# Patient Record
Sex: Female | Born: 1980 | Race: White | Hispanic: No | Marital: Single | Smoking: Never smoker
Health system: Southern US, Community
[De-identification: ages and names within clinical notes are randomized; demographics above are authoritative.]

## PROBLEM LIST (undated history)

## (undated) DIAGNOSIS — I10 Essential (primary) hypertension: Secondary | ICD-10-CM

## (undated) DIAGNOSIS — F419 Anxiety disorder, unspecified: Secondary | ICD-10-CM

---

## 2009-06-21 ENCOUNTER — Emergency Department (HOSPITAL_BASED_OUTPATIENT_CLINIC_OR_DEPARTMENT_OTHER): Admission: EM | Admit: 2009-06-21 | Discharge: 2009-06-21 | Payer: Self-pay | Admitting: Emergency Medicine

## 2009-06-21 ENCOUNTER — Ambulatory Visit: Payer: Self-pay | Admitting: Radiology

## 2010-08-25 LAB — RAPID STREP SCREEN (MED CTR MEBANE ONLY): Streptococcus, Group A Screen (Direct): NEGATIVE

## 2016-05-18 ENCOUNTER — Emergency Department (HOSPITAL_BASED_OUTPATIENT_CLINIC_OR_DEPARTMENT_OTHER): Payer: 59

## 2016-05-18 ENCOUNTER — Emergency Department (HOSPITAL_BASED_OUTPATIENT_CLINIC_OR_DEPARTMENT_OTHER)
Admission: EM | Admit: 2016-05-18 | Discharge: 2016-05-18 | Disposition: A | Discharge status: Home / Self Care | Payer: 59 | Attending: Emergency Medicine | Admitting: Emergency Medicine

## 2016-05-18 ENCOUNTER — Encounter (HOSPITAL_BASED_OUTPATIENT_CLINIC_OR_DEPARTMENT_OTHER): Payer: Self-pay | Admitting: Emergency Medicine

## 2016-05-18 DIAGNOSIS — S99912A Unspecified injury of left ankle, initial encounter: Secondary | ICD-10-CM | POA: Diagnosis present

## 2016-05-18 DIAGNOSIS — Y999 Unspecified external cause status: Secondary | ICD-10-CM | POA: Diagnosis not present

## 2016-05-18 DIAGNOSIS — Y9389 Activity, other specified: Secondary | ICD-10-CM | POA: Insufficient documentation

## 2016-05-18 DIAGNOSIS — Y9259 Other trade areas as the place of occurrence of the external cause: Secondary | ICD-10-CM | POA: Diagnosis not present

## 2016-05-18 DIAGNOSIS — Z79899 Other long term (current) drug therapy: Secondary | ICD-10-CM | POA: Insufficient documentation

## 2016-05-18 DIAGNOSIS — W1839XA Other fall on same level, initial encounter: Secondary | ICD-10-CM | POA: Diagnosis not present

## 2016-05-18 DIAGNOSIS — S92002A Unspecified fracture of left calcaneus, initial encounter for closed fracture: Secondary | ICD-10-CM | POA: Diagnosis not present

## 2016-05-18 DIAGNOSIS — I10 Essential (primary) hypertension: Secondary | ICD-10-CM | POA: Insufficient documentation

## 2016-05-18 HISTORY — DX: Anxiety disorder, unspecified: F41.9

## 2016-05-18 HISTORY — DX: Essential (primary) hypertension: I10

## 2016-05-18 MED ORDER — HYDROCODONE-ACETAMINOPHEN 5-325 MG PO TABS: 1.0000 | ORAL_TABLET | ORAL | 0 refills | Status: AC | PRN

## 2016-05-18 MED ORDER — NAPROXEN 500 MG PO TABS: 500.0000 mg | ORAL_TABLET | Freq: Two times a day (BID) | ORAL | 0 refills | Status: AC

## 2016-05-18 NOTE — ED Triage Notes (Signed)
Pt fell through garage ceiling, landing on left ankle.  Pt c/o pain to left ankle.  Denies any other pain or known injury at this time.

## 2016-05-18 NOTE — ED Notes (Signed)
ED Provider at bedside. 

## 2016-05-18 NOTE — ED Notes (Signed)
Pt states she was getting Christmas decorations out of the attic and fell through injuring her left ankle. Extremity elevated and ice applied. +dpp palp. Feels touch. Cap refill < 3 sec.

## 2016-05-18 NOTE — ED Provider Notes (Signed)
[facility]   CSN: [id number] Arrival date & time: 05/18/16  1528 By signing my name below, I, Bridgette HabermannMaria Tan, attest that this documentation has been prepared under the direction and in the presence of Linwood DibblesJon Ammi Hutt, MD. Electronically Signed: Bridgette HabermannMaria Tan, ED Scribe. 05/18/16. 4:35 PM.  History   Chief Complaint Chief Complaint  Patient presents with  . Ankle Pain   HPI Comments: Kristen Stanley is a 35 y.o. female with h/o HTN who presents to the Emergency Department complaining of left ankle pain s/p mechanical fall just PTA. Pt states she went to get Christmas decorations in her attic and fell through the garage ceiling, landing on concrete. No LOC, pt denies head injury. Pain is exacerbated with bearing weight and movement. She has not taken any OTC medications PTA. She denies any additional injuries. Pt further denies fever, numbness, or any other associated symptoms.  The history is provided by the patient. No language interpreter was used.    Past Medical History:  Diagnosis Date  . Anxiety   . Hypertension     There are no active problems to display for this patient.   Past Surgical History:  Procedure Laterality Date  . CESAREAN SECTION      OB History    No data available       Home Medications    Prior to Admission medications   Medication Sig Start Date End Date Taking? Authorizing Provider  ALPRAZolam Prudy Feeler(XANAX) 0.5 MG tablet Take 0.5 mg by mouth at bedtime as needed for anxiety.   Yes Historical Provider, MD  metoprolol tartrate (LOPRESSOR) 25 MG tablet Take 25 mg by mouth 2 (two) times daily.   Yes Historical Provider, MD  naproxen (NAPROSYN) 500 MG tablet Take 1 tablet (500 mg total) by mouth 2 (two) times daily. 05/18/16   Linwood DibblesJon Erionna Strum, MD    Family History No family history on file.  Social History Social History  Substance Use Topics  . Smoking status: Never Smoker  . Smokeless tobacco: Never Used  . Alcohol use 1.2 oz/week    2  Glasses of wine per week     Comment: occaional, had 2 glasses today.     Allergies   Codeine; Erythromycin; and Penicillins   Review of Systems Review of Systems  Constitutional: Negative for chills and fever.  Musculoskeletal: Positive for arthralgias, joint swelling and myalgias.  Neurological: Negative for numbness.  All other systems reviewed and are negative.  Physical Exam Updated Vital Signs BP (!) 163/122 (BP Location: Right Arm) Comment: 142/112  Pulse 102   Temp 97.7 F (36.5 C) (Oral)   Resp 18   Ht 5\' 3"  (1.6 m)   Wt 69.4 kg   LMP  (LMP Unknown)   SpO2 100%   BMI 27.10 kg/m   Physical Exam  Constitutional: She appears well-developed and well-nourished. No distress.  HENT:  Head: Normocephalic and atraumatic.  Right Ear: External ear normal.  Left Ear: External ear normal.  Eyes: Conjunctivae are normal. Right eye exhibits no discharge. Left eye exhibits no discharge. No scleral icterus.  Neck: Neck supple. No tracheal deviation present.  Cardiovascular: Normal rate.   Pulmonary/Chest: Effort normal. No stridor. No respiratory distress.  Abdominal: She exhibits no distension.  Musculoskeletal: She exhibits no edema.       Left knee: Normal.       Left ankle: She exhibits decreased range of motion and swelling. Tenderness. No lateral malleolus and no medial malleolus tenderness found.  Cervical back: Normal.       Thoracic back: Normal. She exhibits no tenderness.       Lumbar back: Normal. She exhibits no tenderness.  Tenderness to palpation on calcaneus.  Neurological: She is alert. Cranial nerve deficit: no gross deficits.  Skin: Skin is warm and dry. No rash noted.  Psychiatric: She has a normal mood and affect.  Nursing note and vitals reviewed.    ED Treatments / Results  DIAGNOSTIC STUDIES: Oxygen Saturation is 100% on RA, normal by my interpretation.    COORDINATION OF CARE: 4:35 PM Discussed treatment plan with pt at bedside which  includes splint, crutches, and orthopedic referral and pt agreed to plan.   Radiology Dg Ankle Complete Left  Result Date: 05/18/2016 CLINICAL DATA:  Recent fall with left ankle pain, initial encounter EXAM: LEFT ANKLE COMPLETE - 3+ VIEW COMPARISON:  None. FINDINGS: Considerable soft tissue swelling is noted laterally. There are changes consistent with a comminuted calcaneal fracture with mild impaction at the fracture site. No other definitive abnormality is seen. IMPRESSION: Comminuted calcaneal fracture with associated soft tissue swelling. Electronically Signed   By: Alcide CleverMark  Lukens M.D.   On: 05/18/2016 16:11    Procedures Procedures (including critical care time)  Medications Ordered in ED Medications - No data to display   Initial Impression / Assessment and Plan / ED Course  I have reviewed the triage vital signs and the nursing notes.  Pertinent labs & imaging results that were available during my care of the patient were reviewed by me and considered in my medical decision making (see chart for details).  Clinical Course     Xray shows a calcaneal fracture .   No knee or spinal tenderness to suggest associated injuries.   Will refer to Dr Lequita HaltALuisio as an outpatient.   CT scan performed in the ED to help determine her follow up treatment    CT scan reviewed. Additional ligamentous injury noted. Small talus avulsion fracture also noted  Placed in a splint and provided crutches.  Will need to remain non weight bearing.    Final Clinical Impressions(s) / ED Diagnoses   Final diagnoses:  Closed displaced fracture of left calcaneus, unspecified portion of calcaneus, initial encounter    New Prescriptions New Prescriptions   NAPROXEN (NAPROSYN) 500 MG TABLET    Take 1 tablet (500 mg total) by mouth 2 (two) times daily.   I personally performed the services described in this documentation, which was scribed in my presence.  The recorded information has been reviewed and is  accurate.     Linwood DibblesJon Axavier Pressley, MD 05/19/16 [phone]

## 2016-05-18 NOTE — ED Triage Notes (Signed)
Ice pack applied in triage.

## 2016-05-18 NOTE — Discharge Instructions (Signed)
Follow up with Dr Lequita HaltAluisio to determine further treatment for the fracture.  Avoid putting any weight on your injured foot.  Keep the splint on and use the crutches for support.

## 2018-04-24 IMAGING — CT CT FOOT*L* W/O CM
4 of 6 series · 14 of 33 positions shown, 15 images · non-contrast
Comparison: Radiographs from 05/18/2016

CLINICAL DATA: Fall 3 ceiling in to see minute floor of garage.
Left ankle pain and swelling. Calcaneal fracture.

EXAM:
CT OF THE LEFT FOOT WITHOUT CONTRAST
TECHNIQUE: Multidetector CT imaging of the left foot was performed according to
the standard protocol. Multiplanar CT image reconstructions were
also generated.

[Series 6: axial bone · axial · 0.49mm/px · z∈[-207,-90]mm · 3 of 252 slices shown, 4 images]
[im 63/252  soft-tissue]
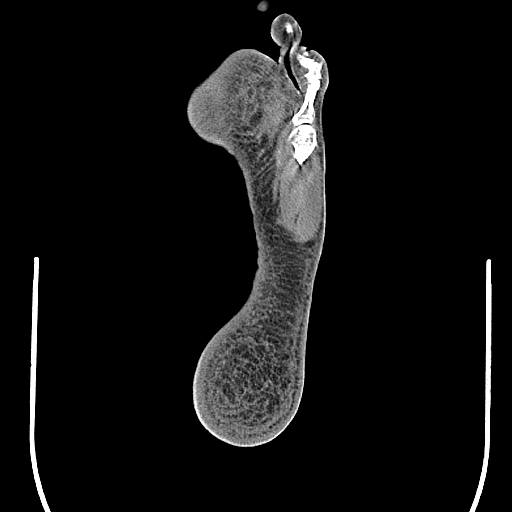
[im 63/252  bone]
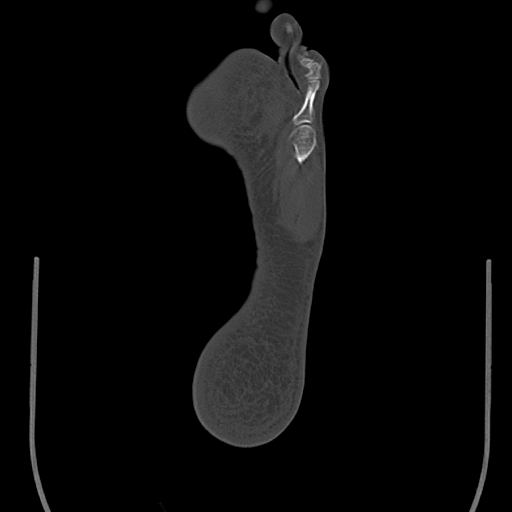
[im 126/252  bone]
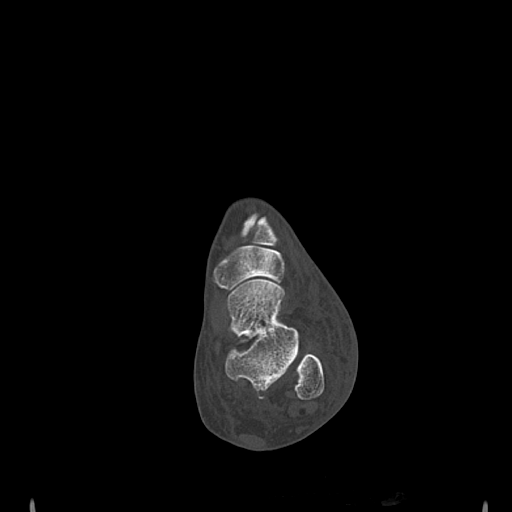
[im 189/252  bone]
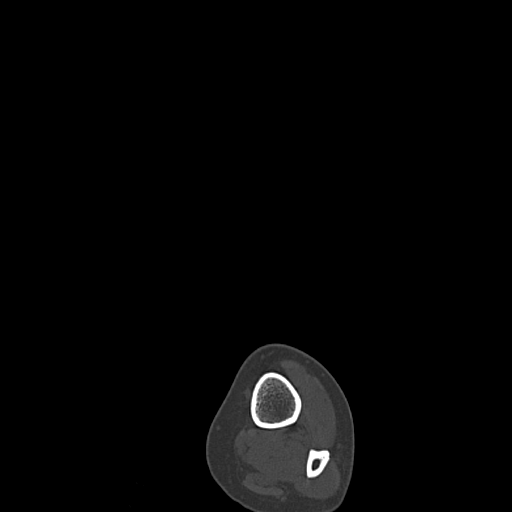

[Series 8: sag bone · sagittal · 0.47mm/px · 5 of 251 slices shown]
[im 42/251  bone]
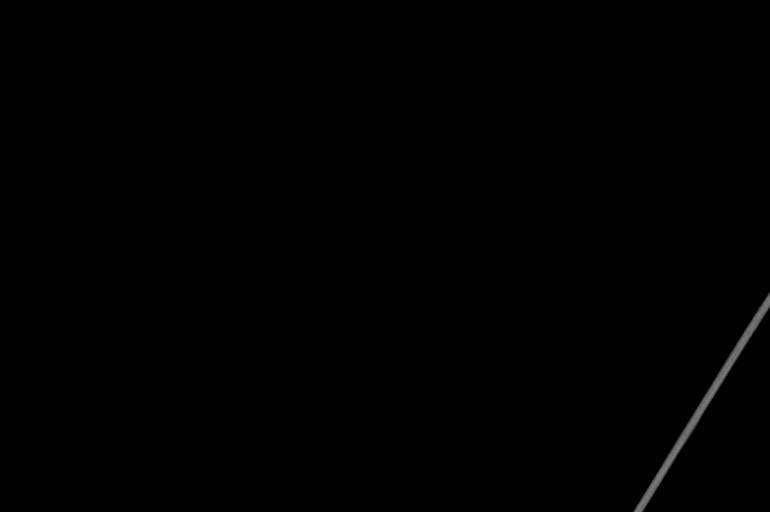
[im 84/251  bone]
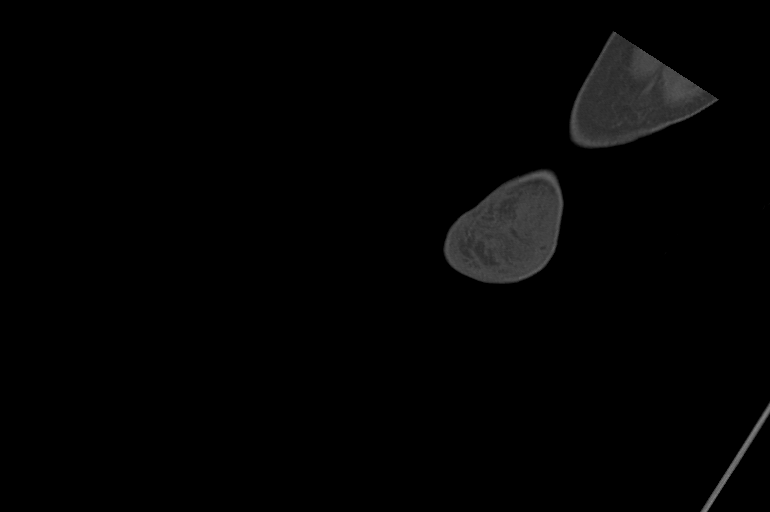
[im 126/251  bone]
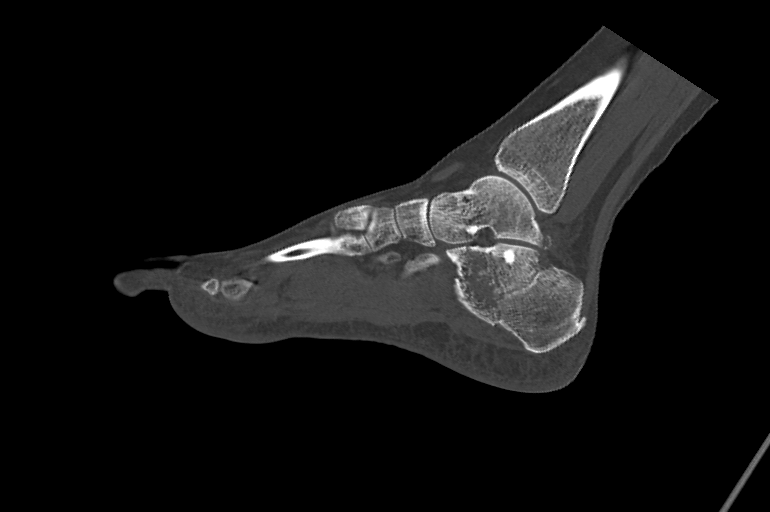
[im 167/251  bone]
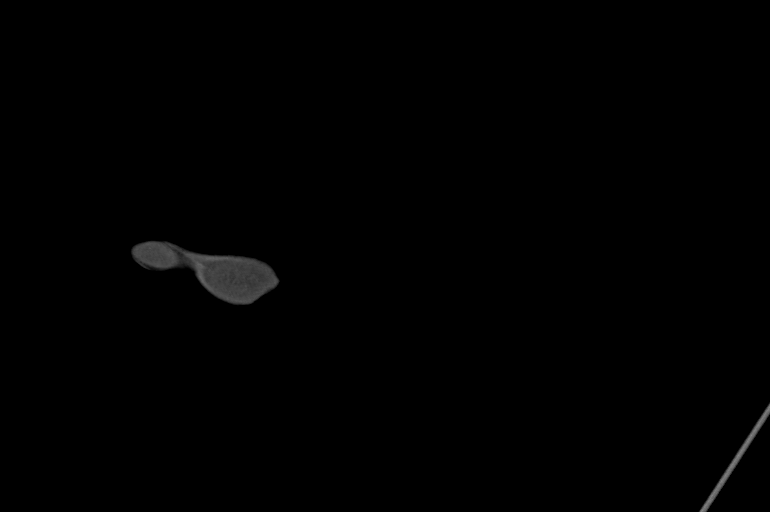
[im 209/251  bone]
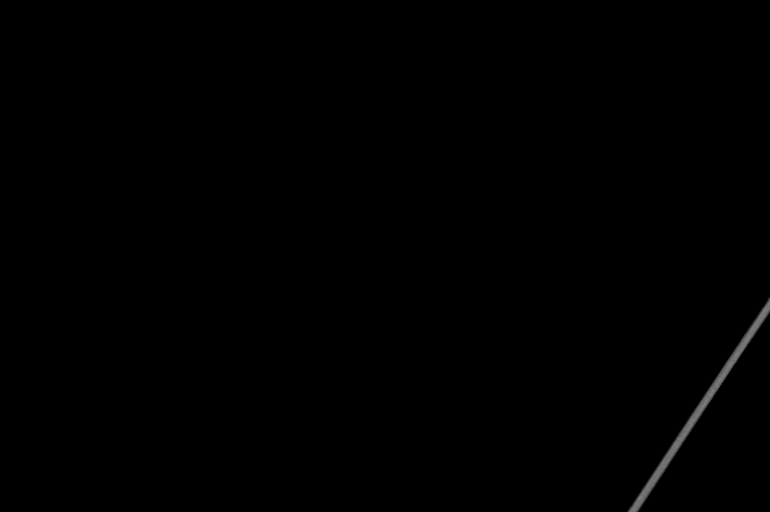

[Series 9: axial st · axial · 0.45mm/px · z∈[-234,-127]mm · 3 of 232 slices shown]
[im 58/232  bone]
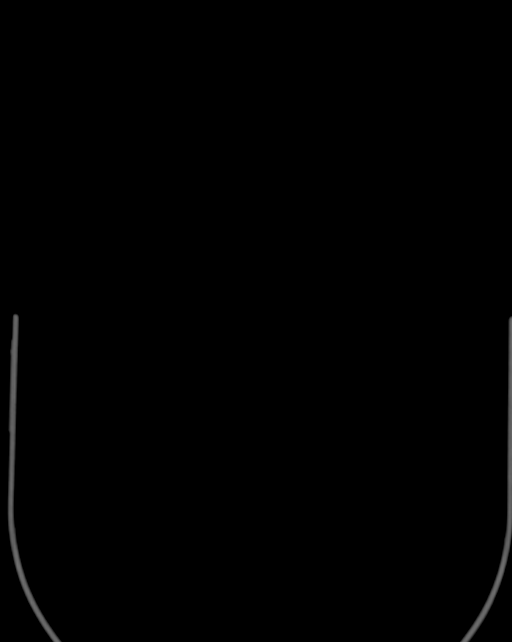
[im 116/232  bone]
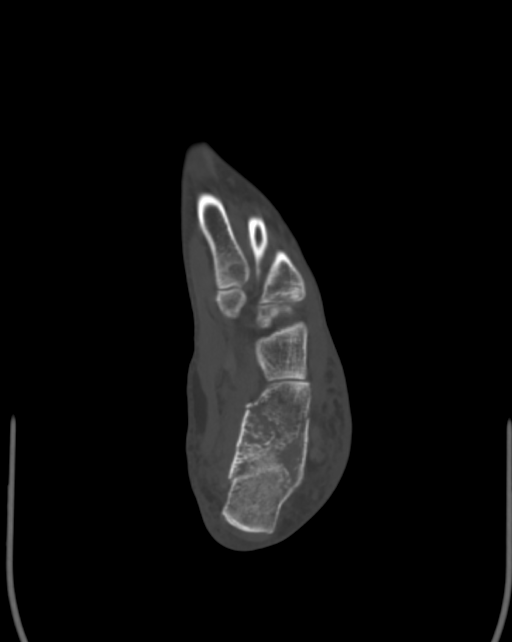
[im 174/232  bone]
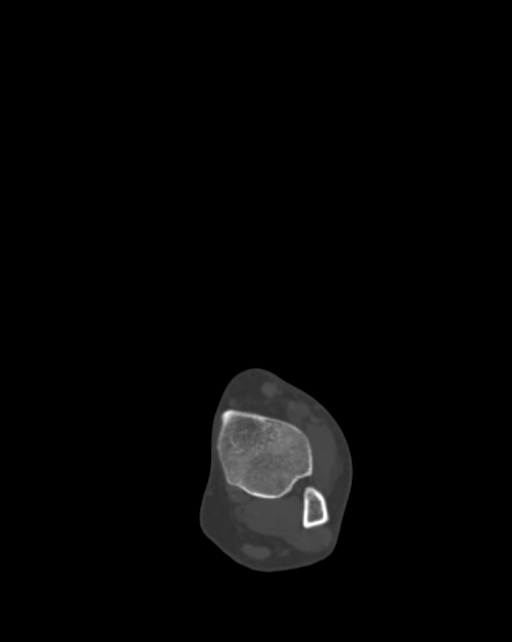

[Series 10: cor st · coronal · 0.31mm/px · 3 of 266 slices shown]
[im 106/266  bone]
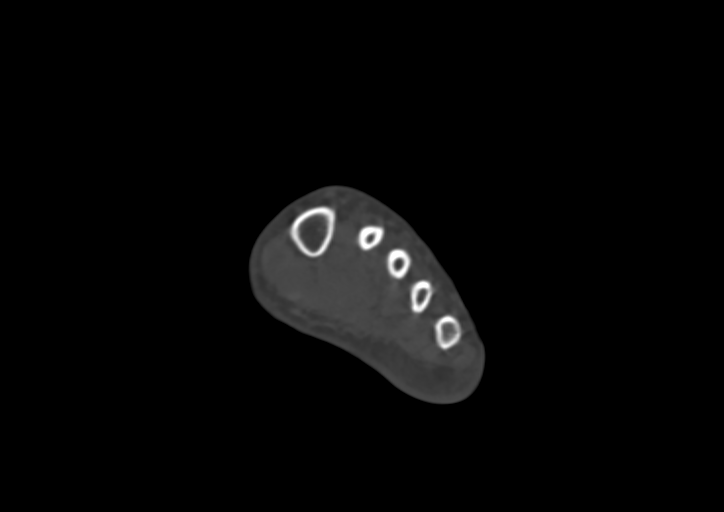
[im 124/266  bone]
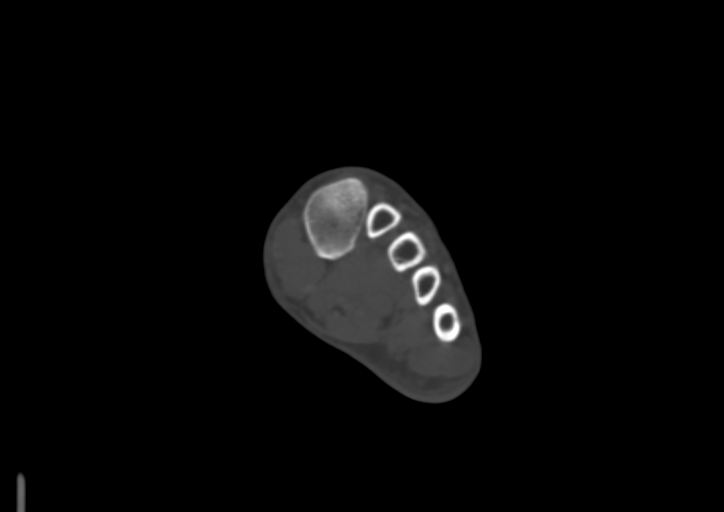
[im 142/266  bone]
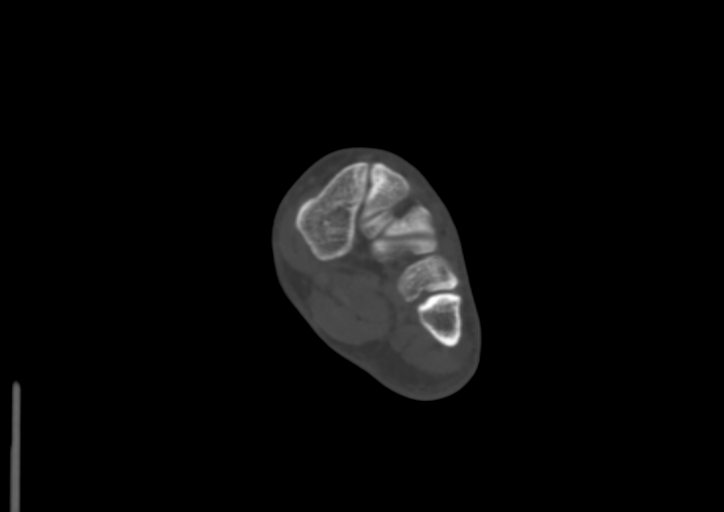

[14 of 33 positions shown; findings below may reference images not displayed]

FINDINGS: Bones/Joint/Cartilage

Comminuted calcaneal fracture noted with fracture planes involving
the articular surfaces of the posterior and middle subtalar facets
and extending to the periphery of the calcaneocuboid articulation,
as well as involving the posterosuperior and inferior surface of the
calcaneus. There is an oblique fracture extending through the base
and anterior portion of the sustentaculum tali. I do not see any
cortical fragments embedded down into the joint and there is only
mild loss of central height of the calcaneus.

Small calcification posterior to the talus is suspicious for a small
trigonal process avulsion.

No other fracture identified. There are bone islands visible in both
the talus and calcaneus on image 125/8.

Small os peroneus.

Ligaments

Probable tear of the anterior talofibular ligament.

Muscles and Tendons

No flexor tendon entrapment.

Soft tissues

Prominent subcutaneous edema along the lateral ankle and tracking to
lesser extent into the dorsum of the foot. Low-level edema along the
ball of the foot.
IMPRESSION: 1. Comminuted fracture of the calcaneus with fracture planes
extending into the posterior and middle subtalar facets and to the
margin of the calcaneocuboid articulation. No cortical fragments
deeply imbedded into the fracture plane ; no flexor tendon
entrapment.
2. Possible small avulsion of the trigonal process of the talus.
3. Probable tear of the anterior talofibular ligament.
4. Prominent lateral edema along the ankle.

## 2022-08-22 ENCOUNTER — Other Ambulatory Visit (HOSPITAL_BASED_OUTPATIENT_CLINIC_OR_DEPARTMENT_OTHER): Payer: Self-pay

## 2022-08-22 MED ORDER — AMPHETAMINE-DEXTROAMPHET ER 20 MG PO CP24
20.0000 mg | ORAL_CAPSULE | Freq: Every day | ORAL | 0 refills | Status: DC
  Filled 2022-08-22 – 2022-08-26 (×2): qty 30, 30d supply, fill #0

## 2022-08-26 ENCOUNTER — Other Ambulatory Visit (HOSPITAL_BASED_OUTPATIENT_CLINIC_OR_DEPARTMENT_OTHER): Payer: Self-pay

## 2022-09-08 ENCOUNTER — Other Ambulatory Visit: Payer: Self-pay

## 2022-09-08 ENCOUNTER — Other Ambulatory Visit (HOSPITAL_BASED_OUTPATIENT_CLINIC_OR_DEPARTMENT_OTHER): Payer: Self-pay

## 2022-09-08 MED ORDER — AZSTARYS 52.3-10.4 MG PO CAPS
1.0000 | ORAL_CAPSULE | Freq: Every day | ORAL | 0 refills | Status: AC
  Filled 2022-09-08: qty 30, 30d supply, fill #0

## 2022-09-17 ENCOUNTER — Other Ambulatory Visit (HOSPITAL_BASED_OUTPATIENT_CLINIC_OR_DEPARTMENT_OTHER): Payer: Self-pay

## 2022-09-17 MED ORDER — AMPHETAMINE-DEXTROAMPHET ER 20 MG PO CP24
20.0000 mg | ORAL_CAPSULE | Freq: Every day | ORAL | 0 refills | Status: AC
  Filled 2022-09-17 – 2022-09-23 (×4): qty 30, 30d supply, fill #0

## 2022-09-18 ENCOUNTER — Other Ambulatory Visit: Payer: Self-pay

## 2022-09-18 ENCOUNTER — Other Ambulatory Visit (HOSPITAL_BASED_OUTPATIENT_CLINIC_OR_DEPARTMENT_OTHER): Payer: Self-pay

## 2022-09-22 ENCOUNTER — Other Ambulatory Visit (HOSPITAL_BASED_OUTPATIENT_CLINIC_OR_DEPARTMENT_OTHER): Payer: Self-pay

## 2022-09-23 ENCOUNTER — Other Ambulatory Visit (HOSPITAL_BASED_OUTPATIENT_CLINIC_OR_DEPARTMENT_OTHER): Payer: Self-pay
